# Patient Record
Sex: Female | Born: 1975 | Race: White | Hispanic: No | Marital: Single | Smoking: Never smoker
Health system: Southern US, Community
[De-identification: ages and names within clinical notes are randomized; demographics above are authoritative.]

## PROBLEM LIST (undated history)

## (undated) DIAGNOSIS — F32A Depression, unspecified: Secondary | ICD-10-CM

## (undated) DIAGNOSIS — E785 Hyperlipidemia, unspecified: Secondary | ICD-10-CM

## (undated) DIAGNOSIS — I10 Essential (primary) hypertension: Secondary | ICD-10-CM

## (undated) DIAGNOSIS — F329 Major depressive disorder, single episode, unspecified: Secondary | ICD-10-CM

## (undated) DIAGNOSIS — E119 Type 2 diabetes mellitus without complications: Secondary | ICD-10-CM

## (undated) HISTORY — PX: CHOLECYSTECTOMY: SHX55

## (undated) HISTORY — PX: ABDOMINAL HYSTERECTOMY: SHX81

---

## 2002-07-23 ENCOUNTER — Inpatient Hospital Stay (HOSPITAL_COMMUNITY): Admission: EM | Admit: 2002-07-23 | Discharge: 2002-07-24 | Payer: Self-pay | Admitting: Psychiatry

## 2011-09-17 DIAGNOSIS — F419 Anxiety disorder, unspecified: Secondary | ICD-10-CM | POA: Insufficient documentation

## 2011-09-17 DIAGNOSIS — K469 Unspecified abdominal hernia without obstruction or gangrene: Secondary | ICD-10-CM | POA: Insufficient documentation

## 2011-09-17 DIAGNOSIS — R5383 Other fatigue: Secondary | ICD-10-CM | POA: Insufficient documentation

## 2011-09-17 DIAGNOSIS — R1013 Epigastric pain: Secondary | ICD-10-CM | POA: Insufficient documentation

## 2011-11-17 DIAGNOSIS — R079 Chest pain, unspecified: Secondary | ICD-10-CM | POA: Insufficient documentation

## 2012-05-21 DIAGNOSIS — B009 Herpesviral infection, unspecified: Secondary | ICD-10-CM | POA: Insufficient documentation

## 2013-01-21 DIAGNOSIS — E785 Hyperlipidemia, unspecified: Secondary | ICD-10-CM | POA: Insufficient documentation

## 2013-01-21 DIAGNOSIS — E1169 Type 2 diabetes mellitus with other specified complication: Secondary | ICD-10-CM | POA: Insufficient documentation

## 2013-01-21 DIAGNOSIS — E66813 Obesity, class 3: Secondary | ICD-10-CM | POA: Insufficient documentation

## 2013-03-11 DIAGNOSIS — C541 Malignant neoplasm of endometrium: Secondary | ICD-10-CM | POA: Insufficient documentation

## 2013-05-16 DIAGNOSIS — Z598 Other problems related to housing and economic circumstances: Secondary | ICD-10-CM | POA: Insufficient documentation

## 2013-05-16 DIAGNOSIS — Z599 Problem related to housing and economic circumstances, unspecified: Secondary | ICD-10-CM | POA: Insufficient documentation

## 2014-11-04 DIAGNOSIS — F429 Obsessive-compulsive disorder, unspecified: Secondary | ICD-10-CM | POA: Insufficient documentation

## 2014-11-04 DIAGNOSIS — F322 Major depressive disorder, single episode, severe without psychotic features: Secondary | ICD-10-CM | POA: Insufficient documentation

## 2015-03-12 DIAGNOSIS — R809 Proteinuria, unspecified: Secondary | ICD-10-CM | POA: Insufficient documentation

## 2016-02-28 DIAGNOSIS — R0683 Snoring: Secondary | ICD-10-CM | POA: Insufficient documentation

## 2016-02-28 DIAGNOSIS — R29818 Other symptoms and signs involving the nervous system: Secondary | ICD-10-CM | POA: Insufficient documentation

## 2016-03-24 DIAGNOSIS — M25561 Pain in right knee: Secondary | ICD-10-CM | POA: Insufficient documentation

## 2017-02-06 DIAGNOSIS — S83206A Unspecified tear of unspecified meniscus, current injury, right knee, initial encounter: Secondary | ICD-10-CM | POA: Insufficient documentation

## 2017-03-14 ENCOUNTER — Encounter: Payer: Self-pay | Admitting: *Deleted

## 2017-03-14 ENCOUNTER — Emergency Department (INDEPENDENT_AMBULATORY_CARE_PROVIDER_SITE_OTHER)
Admission: EM | Admit: 2017-03-14 | Discharge: 2017-03-14 | Disposition: A | Discharge status: Home / Self Care | Payer: Medicaid Other | Source: Home / Self Care | Attending: Family Medicine | Admitting: Family Medicine

## 2017-03-14 DIAGNOSIS — R300 Dysuria: Secondary | ICD-10-CM | POA: Diagnosis not present

## 2017-03-14 DIAGNOSIS — R110 Nausea: Secondary | ICD-10-CM

## 2017-03-14 LAB — POCT URINALYSIS DIP (MANUAL ENTRY)
BILIRUBIN UA: NEGATIVE
BILIRUBIN UA: NEGATIVE mg/dL
GLUCOSE UA: NEGATIVE mg/dL
Leukocytes, UA: NEGATIVE
Nitrite, UA: NEGATIVE
Protein Ur, POC: 100 mg/dL — AB
UROBILINOGEN UA: 1 U/dL
pH, UA: 6 (ref 5.0–8.0)

## 2017-03-14 MED ORDER — ONDANSETRON 4 MG PO TBDP: ORAL_TABLET | ORAL | 0 refills | Status: DC

## 2017-03-14 MED ORDER — NITROFURANTOIN MONOHYD MACRO 100 MG PO CAPS: 100.0000 mg | ORAL_CAPSULE | Freq: Two times a day (BID) | ORAL | 0 refills | Status: DC

## 2017-03-14 NOTE — ED Provider Notes (Signed)
[name]    CSN: [id number] Arrival date & time: 03/14/17  1946     History   Chief Complaint Chief Complaint  Patient presents with  . Dysuria    HPI Tiffany Landry is a 41 y.o. female.   Patient awoke today with urinary frequency, nausea, fatigue, urgency, and sweats.  No abdominal or pelvic pain   The history is provided by the patient.  Dysuria  Pain quality:  Burning Pain severity:  Mild Onset quality:  Sudden Duration:  12 hours Timing:  Constant Progression:  Unchanged Chronicity:  New Recent urinary tract infections: no   Relieved by:  None tried Worsened by:  Nothing Ineffective treatments:  None tried Urinary symptoms: frequent urination and hesitancy   Urinary symptoms: no discolored urine, no foul-smelling urine, no hematuria and no bladder incontinence   Associated symptoms: nausea   Associated symptoms: no abdominal pain, no fever, no flank pain, no genital lesions, no vaginal discharge and no vomiting     History reviewed. No pertinent past medical history.  There are no active problems to display for this patient.   Past Surgical History:  Procedure Laterality Date  . ABDOMINAL HYSTERECTOMY    . CHOLECYSTECTOMY      OB History    No data available       Home Medications    Prior to Admission medications   Medication Sig Start Date End Date Taking? Authorizing Provider  lisinopril (PRINIVIL,ZESTRIL) 10 MG tablet Take 10 mg by mouth daily.   Yes [provider]  metFORMIN (GLUCOPHAGE) 1000 MG tablet Take 1,000 mg by mouth 2 (two) times daily with a meal.   Yes [provider]  nitrofurantoin, macrocrystal-monohydrate, (MACROBID) 100 MG capsule Take 1 capsule (100 mg total) by mouth 2 (two) times daily. Take with food. 03/14/17   Kandra Nicolas, MD  ondansetron (ZOFRAN ODT) 4 MG disintegrating tablet Take one tab by mouth Q6hr prn nausea.  Dissolve under tongue. 03/14/17   Kandra Nicolas, MD    Family  History Family History  Problem Relation Age of Onset  . Heart disease Father     Social History Social History  Substance Use Topics  . Smoking status: Never Smoker  . Smokeless tobacco: Never Used  . Alcohol use No     Allergies   Aspirin   Review of Systems Review of Systems  Constitutional: Negative for fever.  Gastrointestinal: Positive for nausea. Negative for abdominal pain and vomiting.  Genitourinary: Positive for dysuria. Negative for flank pain and vaginal discharge.  All other systems reviewed and are negative.    Physical Exam Triage Vital Signs ED Triage Vitals  Enc Vitals Group     BP 03/14/17 2005 (!) 150/85     Pulse Rate 03/14/17 2005 83     Resp 03/14/17 2005 16     Temp 03/14/17 2005 97.7 F (36.5 C)     Temp Source 03/14/17 2005 Oral     SpO2 03/14/17 2005 97 %     Weight 03/14/17 2006 (!) 304 lb (137.9 kg)     Height 03/14/17 2006 5\' 7"  (1.702 m)     Head Circumference --      Peak Flow --      Pain Score 03/14/17 2006 0     Pain Loc --      Pain Edu? --      Excl. in [place]? --    No data found.   Updated Vital Signs BP Marland Kitchen)  150/85 (BP Location: Left Arm)   Pulse 83   Temp 97.7 F (36.5 C) (Oral)   Resp 16   Ht 5\' 7"  (1.702 m)   Wt (!) 304 lb (137.9 kg)   SpO2 97%   BMI 47.61 kg/m   Visual Acuity Right Eye Distance:   Left Eye Distance:   Bilateral Distance:    Right Eye Near:   Left Eye Near:    Bilateral Near:     Physical Exam Nursing notes and Vital Signs reviewed. Appearance:  Patient appears stated age, and in no acute distress.    Eyes:  Pupils are equal, round, and reactive to light and accomodation.  Extraocular movement is intact.  Conjunctivae are not inflamed   Pharynx:  Normal; moist mucous membranes  Neck:  Supple.  No adenopathy Lungs:  Clear to auscultation.  Breath sounds are equal.  Moving air well. Heart:  Regular rate and rhythm without murmurs, rubs, or gallops.  Abdomen:  Nontender without masses  or hepatosplenomegaly.  Bowel sounds are present.  No CVA or flank tenderness.  Extremities:  No edema.  Skin:  No rash present.     UC Treatments / Results  Labs (all labs ordered are listed, but only abnormal results are displayed) Labs Reviewed  POCT URINALYSIS DIP (MANUAL ENTRY) - Abnormal; Notable for the following:       Result Value   Spec Grav, UA >=1.030 (*)    Blood, UA trace-intact (*)    Protein Ur, POC =100 (*)    All other components within normal limits  URINE CULTURE    EKG  EKG Interpretation None       Radiology No results found.  Procedures Procedures (including critical care time)  Medications Ordered in UC Medications - No data to display   Initial Impression / Assessment and Plan / UC Course  I have reviewed the triage vital signs and the nursing notes.  Pertinent labs & imaging results that were available during my care of the patient were reviewed by me and considered in my medical decision making (see chart for details).    Urine culture pending. Rx for Zofran. Begin Macrobid 100mg  BID for one week. Increase fluid intake. May use non-prescription AZO for about two days, if desired, to decrease urinary discomfort.  If symptoms become significantly worse during the night or over the weekend, proceed to the local emergency room.  Followup with Family Doctor if not improved in one week.    Final Clinical Impressions(s) / UC Diagnoses   Final diagnoses:  Dysuria  Nausea without vomiting    New Prescriptions New Prescriptions   NITROFURANTOIN, MACROCRYSTAL-MONOHYDRATE, (MACROBID) 100 MG CAPSULE    Take 1 capsule (100 mg total) by mouth 2 (two) times daily. Take with food.   ONDANSETRON (ZOFRAN ODT) 4 MG DISINTEGRATING TABLET    Take one tab by mouth Q6hr prn nausea.  Dissolve under tongue.     Kandra Nicolas, MD 03/16/17 [phone]

## 2017-03-14 NOTE — Discharge Instructions (Signed)
Increase fluid intake. May use non-prescription AZO for about two days, if desired, to decrease urinary discomfort.  If symptoms become significantly worse during the night or over the weekend, proceed to the local emergency room.  

## 2017-03-14 NOTE — ED Triage Notes (Signed)
Pt c/o urinary urgency, nausea and dizziness x today. She reports blood sugar at home at 1900 was 168.

## 2017-03-15 LAB — URINE CULTURE: Organism ID, Bacteria: NO GROWTH

## 2017-03-16 ENCOUNTER — Telehealth: Payer: Self-pay

## 2017-03-16 NOTE — Telephone Encounter (Signed)
Left message on VM with UCX results and to call the office if any questions or concerns.

## 2017-05-04 ENCOUNTER — Encounter: Payer: Self-pay | Admitting: Cardiology

## 2017-05-04 ENCOUNTER — Ambulatory Visit (INDEPENDENT_AMBULATORY_CARE_PROVIDER_SITE_OTHER): Payer: Medicaid Other | Admitting: Cardiology

## 2017-05-04 VITALS — BP 124/72 | HR 80 | Ht 67.0 in | Wt 308.0 lb

## 2017-05-04 DIAGNOSIS — E1169 Type 2 diabetes mellitus with other specified complication: Secondary | ICD-10-CM | POA: Diagnosis not present

## 2017-05-04 DIAGNOSIS — E785 Hyperlipidemia, unspecified: Secondary | ICD-10-CM | POA: Diagnosis not present

## 2017-05-04 DIAGNOSIS — R079 Chest pain, unspecified: Secondary | ICD-10-CM | POA: Diagnosis not present

## 2017-05-04 DIAGNOSIS — I10 Essential (primary) hypertension: Secondary | ICD-10-CM | POA: Diagnosis not present

## 2017-05-04 DIAGNOSIS — R002 Palpitations: Secondary | ICD-10-CM | POA: Diagnosis not present

## 2017-05-04 NOTE — Patient Instructions (Signed)
Medication Instructions:   Your physician recommends that you continue on your current medications as directed. Please refer to the Current Medication list given to you today.  Nitroglycerin  has been sent to your pharmacy. This is only to be taken as needed for chest pain only.   Labwork:   NONE  Testing/Procedures:  Your physician has requested that you have a lexiscan myoview. For further information please visit [email or web address]. Please follow instruction sheet, as given.  Your physician has recommended that you wear an event monitor. Event monitors are medical devices that record the heart's electrical activity. Doctors most often [place] these monitors to diagnose arrhythmias. Arrhythmias are problems with the speed or rhythm of the heartbeat. The monitor is a small, portable device. You can wear one while you do your normal daily activities. This is usually used to diagnose what is causing palpitations/syncope (passing out).  These test will be done at our [facility] location.     [address], [place], [place] [address]  Follow-Up:  Your physician recommends that you schedule a follow-up appointment in: 6 months with Dr. Geraldo Pitter for routine check up.    Any Other Special Instructions Will Be Listed Below (If Applicable).     If you need a refill on your cardiac medications before your next appointment, please call your pharmacy.

## 2017-05-04 NOTE — Progress Notes (Signed)
Cardiology Office Note:    Date:  05/04/2017   ID:  Tiffany Landry, DOB 26-Dec-1975, MRN [id number]  PCP:  System, Pcp Not In  Cardiologist:  Jenean Lindau, MD   Referring MD: Gildardo Pounds, NP    ASSESSMENT:    1. Palpitations   2. DM type 2 with diabetic dyslipidemia (HCC)   3. Chest pain, unspecified type   4. Essential hypertension    PLAN:    In order of problems listed above:  1. I discussed my findings with the patient at extensive length. Her palpitations are of concern and we will do a 1 week event monitoring. She is "have blood work at the primary care physician including TSH. 2. She has multiple risk factors for coronary artery disease and we'll do a Lexiscan sestamibi to make sure she has no objective evidence of coronary artery disease. 3. Her blood pressure stable 4. Lipids are followed by her primary care physician. Diet was discussed with dyslipidemia and obesity and she verbalized understanding and plans to be proactive and loose awake. She'll be seen in follow-up appointment in 2 months or earlier if she has any concerns. She knows to go to the nearest emergency room for any significant concerns. Sublingual nitroglycerin prescription was sent. His protocol 911 protocol explained.   Medication Adjustments/Labs and Tests Ordered: Current medicines are reviewed at length with the patient today.  Concerns regarding medicines are outlined above.  Orders Placed This Encounter  Procedures  . Cardiac event monitor  . Myocardial Perfusion Imaging   No orders of the defined types were placed in this encounter.    History of Present Illness:    Tiffany Landry is a 41 y.o. female who is being seen today for the evaluation of Palpitations and chest pain at the request of Gildardo Pounds, NP. Patient is a pleasant 41 year old female. She has past medical history of essential hypertension, dyslipidemia and diabetes mellitus. She is morbidly obese and leads a sedentary  lifestyle. She tells me that she was evaluated for palpitations and did a Holter monitor for 2 days and was not happy with that. She was told her that her results were fine. She also complains of chest tightness at times this is not related to exertion. Again she leads a sedentary lifestyle. At the time of my evaluation she is alert awake oriented and in no distress. No radiation of chest symptoms to the neck or to the arms.  History reviewed. No pertinent past medical history.  Past Surgical History:  Procedure Laterality Date  . ABDOMINAL HYSTERECTOMY    . CHOLECYSTECTOMY      Current Medications: Current Meds  Medication Sig  . CINNAMON PO Take 500 mg by mouth 4 (four) times daily.  . fluvoxaMINE (LUVOX) 25 MG tablet Take 25 mg by mouth 2 (two) times daily.  Marland Kitchen lisinopril (PRINIVIL,ZESTRIL) 5 MG tablet Take 5 mg by mouth daily.  . metFORMIN (GLUCOPHAGE) 1000 MG tablet Take 1,000 mg by mouth 2 (two) times daily with a meal.  . OMEGA-3 FATTY ACIDS PO Take 1,200 mg by mouth 2 (two) times daily.  Marland Kitchen VITAMIN E PO Take 180 mg by mouth daily.  . [DISCONTINUED] lisinopril (PRINIVIL,ZESTRIL) 10 MG tablet Take 10 mg by mouth daily.     Allergies:   Aspirin   Social History   Social History  . Marital status: Single    Spouse name: N/A  . Number of children: N/A  . Years of education: N/A  Social History Main Topics  . Smoking status: Never Smoker  . Smokeless tobacco: Never Used  . Alcohol use No  . Drug use: No  . Sexual activity: Not Asked   Other Topics Concern  . None   Social History Narrative  . None     Family History: The patient's family history includes Arrhythmia in her father; Heart disease in her father and paternal grandfather.  ROS:   Please see the history of present illness.    All other systems reviewed and are negative.  EKGs/Labs/Other Studies Reviewed:    The following studies were reviewed today: I reviewed records including Holter monitor report  from the other cardiologist office and it was unremarkable. EKG done today reveals sinus rhythm with nonspecific ST-T changes.   Recent Labs: No results found for requested labs within last 8760 hours.  Recent Lipid Panel No results found for: CHOL, TRIG, HDL, CHOLHDL, VLDL, LDLCALC, LDLDIRECT  Physical Exam:    VS:  BP 124/72   Pulse 80   Ht 5\' 7"  (1.702 m)   Wt (!) 308 lb 0.6 oz (139.7 kg)   SpO2 97%   BMI 48.25 kg/m     Wt Readings from Last 3 Encounters:  05/04/17 (!) 308 lb 0.6 oz (139.7 kg)  03/14/17 (!) 304 lb (137.9 kg)     GEN: Patient is in no acute distress HEENT: Normal NECK: No JVD; No carotid bruits LYMPHATICS: No lymphadenopathy CARDIAC: S1 S2 regular, 2/6 systolic murmur at the apex. RESPIRATORY:  Clear to auscultation without rales, wheezing or rhonchi  ABDOMEN: Soft, non-tender, non-distended MUSCULOSKELETAL:  No edema; No deformity  SKIN: Warm and dry NEUROLOGIC:  Alert and oriented x 3 PSYCHIATRIC:  Normal affect    Signed, Jenean Lindau, MD  05/04/2017 3:51 PM    [facility]

## 2017-05-08 ENCOUNTER — Other Ambulatory Visit (INDEPENDENT_AMBULATORY_CARE_PROVIDER_SITE_OTHER): Payer: Medicaid Other

## 2017-05-08 DIAGNOSIS — R079 Chest pain, unspecified: Secondary | ICD-10-CM | POA: Diagnosis not present

## 2017-05-21 ENCOUNTER — Ambulatory Visit (HOSPITAL_COMMUNITY): Payer: Self-pay

## 2017-05-22 ENCOUNTER — Encounter: Payer: Self-pay | Admitting: *Deleted

## 2017-05-22 ENCOUNTER — Ambulatory Visit (HOSPITAL_COMMUNITY): Payer: Medicaid Other

## 2017-05-22 NOTE — Progress Notes (Signed)
Patient ID: Tiffany Landry, female   DOB: 30-Oct-1975, 41 y.o.   MRN: [id number] Patient did not show up for 05/22/17, 2:00 PM, appointment to have a cardiac event monitor applied.

## 2017-05-28 ENCOUNTER — Telehealth (HOSPITAL_COMMUNITY): Payer: Self-pay | Admitting: *Deleted

## 2017-05-28 NOTE — Telephone Encounter (Signed)
Left message on voicemail in reference to upcoming appointment scheduled for 05/29/17. Phone number given for a call back so details instructions can be given. Lilyona Richner, Ranae Palms

## 2017-05-29 ENCOUNTER — Ambulatory Visit (HOSPITAL_COMMUNITY): Payer: Medicaid Other | Attending: Cardiology

## 2017-05-29 DIAGNOSIS — R079 Chest pain, unspecified: Secondary | ICD-10-CM | POA: Diagnosis not present

## 2017-05-29 MED ORDER — TECHNETIUM TC 99M TETROFOSMIN IV KIT
32.4000 | PACK | Freq: Once | INTRAVENOUS | Status: AC | PRN
  Administered 2017-05-29: 32.4 via INTRAVENOUS
  Filled 2017-05-29: qty 33

## 2017-05-31 ENCOUNTER — Ambulatory Visit (INDEPENDENT_AMBULATORY_CARE_PROVIDER_SITE_OTHER): Payer: Medicaid Other

## 2017-05-31 ENCOUNTER — Ambulatory Visit (HOSPITAL_COMMUNITY): Payer: Medicaid Other | Attending: Interventional Cardiology

## 2017-05-31 DIAGNOSIS — E1169 Type 2 diabetes mellitus with other specified complication: Secondary | ICD-10-CM | POA: Diagnosis not present

## 2017-05-31 DIAGNOSIS — E785 Hyperlipidemia, unspecified: Secondary | ICD-10-CM | POA: Diagnosis not present

## 2017-05-31 DIAGNOSIS — I10 Essential (primary) hypertension: Secondary | ICD-10-CM | POA: Diagnosis not present

## 2017-05-31 DIAGNOSIS — R079 Chest pain, unspecified: Secondary | ICD-10-CM

## 2017-05-31 DIAGNOSIS — R002 Palpitations: Secondary | ICD-10-CM | POA: Diagnosis not present

## 2017-05-31 LAB — MYOCARDIAL PERFUSION IMAGING
CHL CUP NUCLEAR SSS: 4
LV sys vol: 62 mL
LVDIAVOL: 131 mL (ref 46–106)
Peak HR: 109 {beats}/min
RATE: 0.29
Rest HR: 77 {beats}/min
SDS: 3
SRS: 1
TID: 0.92

## 2017-05-31 MED ORDER — REGADENOSON 0.4 MG/5ML IV SOLN
0.4000 mg | Freq: Once | INTRAVENOUS | Status: AC
  Administered 2017-05-31: 0.4 mg via INTRAVENOUS

## 2017-05-31 MED ORDER — TECHNETIUM TC 99M TETROFOSMIN IV KIT
32.6000 | PACK | Freq: Once | INTRAVENOUS | Status: AC | PRN
  Administered 2017-05-31: 32.6 via INTRAVENOUS
  Filled 2017-05-31: qty 33

## 2017-06-04 ENCOUNTER — Telehealth: Payer: Self-pay

## 2017-06-04 NOTE — Telephone Encounter (Signed)
Patient requesting cardiac clearance for tooth needing to be pulled. Per Dr. Geraldo Pitter this is okay. Faxed documents needed to [place] with Dr. Valentino Nose office. Mattie Marlin

## 2017-06-19 ENCOUNTER — Ambulatory Visit: Payer: Medicaid Other | Admitting: Cardiology

## 2017-06-19 ENCOUNTER — Telehealth: Payer: Self-pay | Admitting: Cardiology

## 2017-06-19 ENCOUNTER — Other Ambulatory Visit: Payer: Self-pay

## 2017-06-19 DIAGNOSIS — R002 Palpitations: Secondary | ICD-10-CM

## 2017-06-19 NOTE — Telephone Encounter (Signed)
Per Dr. Geraldo Pitter an echo will be ordered. Patient is scheduled for 11/16 at 09:30. She is agreeable with this plan.

## 2017-06-19 NOTE — Telephone Encounter (Signed)
New message     Patient is requesting labs to be ordered. States she feels something more can be done to figure out why she is having palpitations. Please call Patient cancelled appointment for today, did not wish to reschedule at this time.  Patient c/o Palpitations:  High priority if patient c/o lightheadedness, shortness of breath, or chest pain  1) How long have you had palpitations/irregular HR/ Afib? Are you having the symptoms now? Palpitations everyday  2) Are you currently experiencing lightheadedness, SOB or CP? NO  3) Do you have a history of afib (atrial fibrillation) or irregular heart rhythm? NO  4) Have you checked your BP or HR? (document readings if available): no  5) Are you experiencing any other symptoms? No

## 2017-07-18 ENCOUNTER — Telehealth: Payer: Self-pay | Admitting: Cardiology

## 2017-07-18 NOTE — Telephone Encounter (Signed)
Will call as soon as I have results.

## 2017-07-18 NOTE — Telephone Encounter (Signed)
Wants monitor results °

## 2017-07-19 ENCOUNTER — Telehealth: Payer: Self-pay

## 2017-07-19 ENCOUNTER — Other Ambulatory Visit: Payer: Self-pay

## 2017-07-19 MED ORDER — METOPROLOL TARTRATE 25 MG PO TABS: 25.0000 mg | ORAL_TABLET | Freq: Two times a day (BID) | ORAL | 0 refills | Status: DC

## 2017-07-19 NOTE — Telephone Encounter (Signed)
Left voicemail for the patient to call the office. 

## 2017-07-19 NOTE — Telephone Encounter (Signed)
Informed patient of her monitor results and explained the need for the beta blocker. The patient was agreeable.

## 2017-07-20 ENCOUNTER — Ambulatory Visit (HOSPITAL_BASED_OUTPATIENT_CLINIC_OR_DEPARTMENT_OTHER)
Admission: RE | Admit: 2017-07-20 | Discharge: 2017-07-20 | Disposition: A | Discharge status: Home / Self Care | Payer: Medicaid Other | Source: Ambulatory Visit | Attending: Cardiology | Admitting: Cardiology

## 2017-07-20 ENCOUNTER — Other Ambulatory Visit (HOSPITAL_BASED_OUTPATIENT_CLINIC_OR_DEPARTMENT_OTHER): Payer: Medicaid Other

## 2017-07-20 DIAGNOSIS — I10 Essential (primary) hypertension: Secondary | ICD-10-CM | POA: Diagnosis not present

## 2017-07-20 DIAGNOSIS — R002 Palpitations: Secondary | ICD-10-CM

## 2017-07-20 DIAGNOSIS — E119 Type 2 diabetes mellitus without complications: Secondary | ICD-10-CM | POA: Insufficient documentation

## 2017-07-20 DIAGNOSIS — E785 Hyperlipidemia, unspecified: Secondary | ICD-10-CM | POA: Diagnosis not present

## 2017-07-20 NOTE — Progress Notes (Signed)
  Echocardiogram 2D Echocardiogram has been performed.  Gloria Lambertson T Rigo Letts 07/20/2017, 3:26 PM

## 2017-07-24 ENCOUNTER — Telehealth: Payer: Self-pay | Admitting: Cardiology

## 2017-07-24 NOTE — Telephone Encounter (Signed)
Spoke with patient to calm her nerves regarding her echo and monitor report.

## 2017-07-24 NOTE — Telephone Encounter (Signed)
Please call patient regarding her concerns over her BP medicine.Marland Kitchen

## 2017-08-09 ENCOUNTER — Ambulatory Visit: Payer: Medicaid Other | Admitting: Cardiology

## 2017-08-09 ENCOUNTER — Encounter: Payer: Self-pay | Admitting: Cardiology

## 2017-08-09 VITALS — BP 132/78 | HR 78 | Ht 67.0 in | Wt 308.1 lb

## 2017-08-09 DIAGNOSIS — E1169 Type 2 diabetes mellitus with other specified complication: Secondary | ICD-10-CM | POA: Diagnosis not present

## 2017-08-09 DIAGNOSIS — I10 Essential (primary) hypertension: Secondary | ICD-10-CM | POA: Diagnosis not present

## 2017-08-09 DIAGNOSIS — E785 Hyperlipidemia, unspecified: Secondary | ICD-10-CM

## 2017-08-09 NOTE — Patient Instructions (Signed)
Medication Instructions:  Your physician recommends that you continue on your current medications as directed. Please refer to the Current Medication list given to you today.  Labwork: None  Testing/Procedures: None  Follow-Up: Your physician recommends that you schedule a follow-up appointment in: 6 months  Any Other Special Instructions Will Be Listed Below (If Applicable).     If you need a refill on your cardiac medications before your next appointment, please call your pharmacy.   [facility]  Elijahjames Fuelling A, RN, BSN  

## 2017-08-09 NOTE — Progress Notes (Signed)
Cardiology Office Note:    Date:  08/09/2017   ID:  Tiffany Landry, DOB 09/29/1975, MRN [id number]  PCP:  Daleen Snook, NP  Cardiologist:  Jenean Lindau, MD   Referring MD: Daleen Snook, NP    ASSESSMENT:    1. Essential hypertension   2. DM type 2 with diabetic dyslipidemia (HCC)   3. Obesity, Class III, BMI 40-49.9 (morbid obesity) ([place])    PLAN:    In order of problems listed above:  1. Primary prevention stressed to the patient.  Importance of compliance with diet and medications stressed and she vocalized understanding.  Diet was discussed for obesity and this were explained and she plans to diet and eat appropriately and lose weight.  Importance of regular exercise stressed and she plans to start a regular exercise program.  She is asymptomatic from this standpoint. 2. Her blood pressure is stable 3. I have mentioned to her that she needs to continue aspirin in view of the fact that she is diabetic.  I also discussed with her statin therapy is essential antibiotics and guidelines were discussed and she will discuss this with her primary care physician who is managing this aspect of her care. 4. Patient will be seen in follow-up appointment in 6 months or earlier if the patient has any concerns    Medication Adjustments/Labs and Tests Ordered: Current medicines are reviewed at length with the patient today.  Concerns regarding medicines are outlined above.  No orders of the defined types were placed in this encounter.  No orders of the defined types were placed in this encounter.    Chief Complaint  Patient presents with  . Follow-up  . Palpitations    has been taking the metoprolol     History of Present Illness:    Tiffany Landry is a 41 y.o. female.  She has past medical history of essential hypertension dyslipidemia, diabetes mellitus and morbid obesity.  She leads a very sedentary lifestyle.  She was evaluated for symptoms of palpitations.  Holter  monitoring and event monitoring has been unremarkable with a 5 beat nonsustained ventricular tachycardia.  Ejection fraction is preserved and there was no evidence of ischemia on the stress test.  I asked the patient repeatedly if she had any chest pain and she denies this.  She tells me that she is active.  She walks the dog and she can walk for about half an hour on a regular basis but she does not exercise and leads a sedentary lifestyle.  When she walks the dog's she has no chest pain or any such symptoms.  She tells me that when she is sleeping by herself in the night she can feel her heart skipped beats.  No palpitations just a feeling of skipped beats which worries her.  She is taking metoprolol regularly.  She stopped taking lipid medications given by her primary care physician.  She courts no particular reason for quitting it.  History reviewed. No pertinent past medical history.  Past Surgical History:  Procedure Laterality Date  . ABDOMINAL HYSTERECTOMY    . CHOLECYSTECTOMY      Current Medications: Current Meds  Medication Sig  . aspirin 81 MG tablet Take by mouth.  Marland Kitchen buPROPion (WELLBUTRIN XL) 150 MG 24 hr tablet Take by mouth.  Marland Kitchen glucose blood (ONE TOUCH ULTRA TEST) test strip 1 Container by Misc.(Non-Drug; Combo Route) route daily.  Marland Kitchen lisinopril (PRINIVIL,ZESTRIL) 5 MG tablet Take 10 mg by mouth daily.   . metFORMIN (  GLUCOPHAGE) 1000 MG tablet Take 1,000 mg by mouth 2 (two) times daily with a meal.  . metoprolol tartrate (LOPRESSOR) 25 MG tablet Take 1 tablet (25 mg total) 2 (two) times daily by mouth.  . OMEGA-3 FATTY ACIDS PO Take 1,200 mg by mouth 2 (two) times daily.  Marland Kitchen omeprazole (PRILOSEC) 20 MG capsule Take by mouth.  . Red Yeast Rice 600 MG CAPS Take 1,200 mg by mouth daily.  Marland Kitchen VITAMIN E PO Take 180 mg by mouth daily.     Allergies:   Patient has no known allergies.   Social History   Socioeconomic History  . Marital status: Single    Spouse name: None  . Number  of children: None  . Years of education: None  . Highest education level: None  Social Needs  . Financial resource strain: None  . Food insecurity - worry: None  . Food insecurity - inability: None  . Transportation needs - medical: None  . Transportation needs - non-medical: None  Occupational History  . None  Tobacco Use  . Smoking status: Never Smoker  . Smokeless tobacco: Never Used  Substance and Sexual Activity  . Alcohol use: No  . Drug use: No  . Sexual activity: None  Other Topics Concern  . None  Social History Narrative  . None     Family History: The patient's family history includes Arrhythmia in her father; Heart disease in her father and paternal grandfather.  ROS:   Please see the history of present illness.    All other systems reviewed and are negative.  EKGs/Labs/Other Studies Reviewed:    The following studies were reviewed today: I discussed findings of the echocardiogram and stress test with the patient at extensive length.  Holter monitoring report was also discussed at length and questions were answered to her satisfaction.   Recent Labs: No results found for requested labs within last 8760 hours.  Recent Lipid Panel No results found for: CHOL, TRIG, HDL, CHOLHDL, VLDL, LDLCALC, LDLDIRECT  Physical Exam:    VS:  BP 132/78 (BP Location: Left Arm, Patient Position: Sitting)   Pulse 78   Ht 5\' 7"  (1.702 m)   Wt (!) 308 lb 1.3 oz (139.7 kg)   SpO2 98%   BMI 48.25 kg/m     Wt Readings from Last 3 Encounters:  08/09/17 (!) 308 lb 1.3 oz (139.7 kg)  05/29/17 (!) 308 lb (139.7 kg)  05/04/17 (!) 308 lb 0.6 oz (139.7 kg)     GEN: Patient is in no acute distress HEENT: Normal NECK: No JVD; No carotid bruits LYMPHATICS: No lymphadenopathy CARDIAC: Hear sounds regular, 2/6 systolic murmur at the apex. RESPIRATORY:  Clear to auscultation without rales, wheezing or rhonchi  ABDOMEN: Soft, non-tender, non-distended MUSCULOSKELETAL:  No edema;  No deformity  SKIN: Warm and dry NEUROLOGIC:  Alert and oriented x 3 PSYCHIATRIC:  Normal affect   Signed, Jenean Lindau, MD  08/09/2017 11:14 AM    [place]

## 2017-10-01 ENCOUNTER — Other Ambulatory Visit: Payer: Self-pay

## 2017-10-01 ENCOUNTER — Encounter: Payer: Self-pay | Admitting: *Deleted

## 2017-10-01 ENCOUNTER — Emergency Department
Admission: EM | Admit: 2017-10-01 | Discharge: 2017-10-01 | Disposition: A | Discharge status: Home / Self Care | Payer: Medicaid Other | Source: Home / Self Care | Attending: Family Medicine | Admitting: Family Medicine

## 2017-10-01 DIAGNOSIS — R110 Nausea: Secondary | ICD-10-CM

## 2017-10-01 DIAGNOSIS — R197 Diarrhea, unspecified: Secondary | ICD-10-CM

## 2017-10-01 HISTORY — DX: Type 2 diabetes mellitus without complications: E11.9

## 2017-10-01 HISTORY — DX: Essential (primary) hypertension: I10

## 2017-10-01 HISTORY — DX: Hyperlipidemia, unspecified: E78.5

## 2017-10-01 LAB — POCT CBC W AUTO DIFF (K'VILLE URGENT CARE)

## 2017-10-01 MED ORDER — CIPROFLOXACIN HCL 500 MG PO TABS: 500.0000 mg | ORAL_TABLET | Freq: Two times a day (BID) | ORAL | 0 refills | Status: DC

## 2017-10-01 MED ORDER — ONDANSETRON HCL 4 MG PO TABS
4.0000 mg | ORAL_TABLET | Freq: Four times a day (QID) | ORAL | 0 refills | Status: DC
Start: 2017-10-01 — End: 2019-08-14

## 2017-10-01 MED ORDER — METRONIDAZOLE 500 MG PO TABS: 500.0000 mg | ORAL_TABLET | Freq: Three times a day (TID) | ORAL | 0 refills | Status: DC

## 2017-10-01 NOTE — ED Triage Notes (Signed)
Pt c/o diarrhea, upper abd pain and some nausea x 2 wks. Denies vomiting or blood in stool.

## 2017-10-01 NOTE — ED Provider Notes (Signed)
[facility]    CSN: [id number] Arrival date & time: 10/01/17  1921     History   Chief Complaint Chief Complaint  Patient presents with  . Diarrhea    HPI Tiffany Landry is a 42 y.o. female.   HPI Tiffany Landry is a 42 y.o. female presenting to UC with c/o 2 weeks of intermittent watery diarrhea that worsened over the last 2-3 days.  Today she had 8-10 episodes of loose to watery stool in last 24 hours.  She reports scant red blood in stool but believes that is due to hemorrhoids "I am so raw down there."  She has not tried any OTC medications for her symptoms. Mild diffuse abdominal cramping. Mild nausea but no vomiting, fever, or chills.  She had her gallbladder removed several years ago. She notes if she eats fatty foods, it causes loose stools and stomach upset but she has not had any of those foods recently. Denies sick contacts or recent travel.    Past Medical History:  Diagnosis Date  . Diabetes mellitus without complication ([place])   . Hyperlipidemia   . Hypertension     Patient Active Problem List   Diagnosis Date Noted  . Palpitations 05/04/2017  . Essential hypertension 05/04/2017  . Acute meniscal tear of right knee 02/06/2017  . Right knee pain 03/24/2016  . Snoring 02/28/2016  . Suspected sleep apnea 02/28/2016  . Microalbuminuria 03/12/2015  . Major depressive disorder, single episode, severe without psychotic features ([place]) 11/04/2014  . Obsessive compulsive disorder 11/04/2014  . Financial difficulty 05/16/2013  . Endometrial cancer ([place]) 03/11/2013  . 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 Obesity, Class III, BMI 40-49.9 (morbid obesity) ([place]) 01/21/2013  . HSV-1 (herpes simplex virus 1) infection 05/21/2012  . Chest pain 11/17/2011  . Abdominal hernia without obstruction or gangrene 09/17/2011  . Anxiety 09/17/2011  . Epigastric pain 09/17/2011  . Other fatigue 09/17/2011    Past Surgical History:  Procedure Laterality  Date  . ABDOMINAL HYSTERECTOMY    . CHOLECYSTECTOMY      OB History    No data available       Home Medications    Prior to Admission medications   Medication Sig Start Date End Date Taking? Authorizing Provider  buPROPion (WELLBUTRIN XL) 150 MG 24 hr tablet Take by mouth. 07/10/17 10/08/17 Yes [provider]  metFORMIN (GLUCOPHAGE) 1000 MG tablet Take 1,000 mg by mouth 2 (two) times daily with a meal.   Yes [provider]  metoprolol tartrate (LOPRESSOR) 25 MG tablet Take 1 tablet (25 mg total) 2 (two) times daily by mouth. 07/19/17 10/17/17 Yes Revankar, Reita Cliche, MD  aspirin 81 MG tablet Take by mouth.    [provider]  CINNAMON PO Take 500 mg by mouth 4 (four) times daily.    [provider]  ciprofloxacin (CIPRO) 500 MG tablet Take 1 tablet (500 mg total) by mouth 2 (two) times daily. One po bid x 7 days 10/01/17   Noe Gens, PA-C  fluvoxaMINE (LUVOX) 25 MG tablet Take 25 mg by mouth 2 (two) times daily. 04/17/17 05/17/17  [provider]  glucose blood (ONE TOUCH ULTRA TEST) test strip 1 Container by Misc.(Non-Drug; Combo Route) route daily. 11/26/12   [provider]  lisinopril (PRINIVIL,ZESTRIL) 5 MG tablet Take 10 mg by mouth daily.  11/27/16 11/27/17  [provider]  metroNIDAZOLE (FLAGYL) 500 MG tablet Take 1 tablet (500 mg total) by mouth  3 (three) times daily. 10/01/17   Noe Gens, PA-C  OMEGA-3 FATTY ACIDS PO Take 1,200 mg by mouth 2 (two) times daily.    [provider]  omeprazole (PRILOSEC) 20 MG capsule Take by mouth. 07/13/17   [provider]  ondansetron (ZOFRAN) 4 MG tablet Take 1 tablet (4 mg total) by mouth every 6 (six) hours. 10/01/17   Noe Gens, PA-C  Red Yeast Rice 600 MG CAPS Take 1,200 mg by mouth daily.    [provider]  VITAMIN E PO Take 180 mg by mouth daily.    [provider]    Family History Family History  Problem Relation Age of Onset    . Heart disease Father   . Arrhythmia Father   . Heart disease Paternal Grandfather     Social History Social History   Tobacco Use  . Smoking status: Never Smoker  . Smokeless tobacco: Never Used  Substance Use Topics  . Alcohol use: No  . Drug use: No     Allergies   Patient has no known allergies.   Review of Systems Review of Systems  Constitutional: Positive for unexpected weight change. Negative for chills, fatigue and fever.  Gastrointestinal: Positive for abdominal pain, blood in stool, diarrhea and nausea. Negative for constipation and vomiting.  Genitourinary: Negative for dysuria, frequency and hematuria.  Musculoskeletal: Negative for back pain and myalgias.  Neurological: Negative for dizziness, syncope, light-headedness and headaches.     Physical Exam Triage Vital Signs ED Triage Vitals [10/01/17 1942]  Enc Vitals Group     BP 124/85     Pulse Rate 91     Resp 16     Temp (!) 97.4 F (36.3 C)     Temp Source Oral     SpO2 96 %     Weight 300 lb (136.1 kg)     Height 5\' 7"  (1.702 m)     Head Circumference      Peak Flow      Pain Score 6     Pain Loc      Pain Edu?      Excl. in [place]?    No data found.  Updated Vital Signs BP 124/85 (BP Location: Right Arm)   Pulse 91   Temp (!) 97.4 F (36.3 C) (Oral)   Resp 16   Ht 5\' 7"  (1.702 m)   Wt 300 lb (136.1 kg)   SpO2 96%   BMI 46.99 kg/m   Visual Acuity Right Eye Distance:   Left Eye Distance:   Bilateral Distance:    Right Eye Near:   Left Eye Near:    Bilateral Near:     Physical Exam  Constitutional: She is oriented to person, place, and time. She appears well-developed and well-nourished. No distress.  HENT:  Head: Normocephalic and atraumatic.  Mouth/Throat: Oropharynx is clear and moist.  Eyes: EOM are normal.  Neck: Normal range of motion. Neck supple.  Cardiovascular: Normal rate and regular rhythm.  Pulmonary/Chest: Effort normal and breath sounds normal. No stridor.  No respiratory distress. She has no wheezes. She has no rales.  Abdominal: Soft. She exhibits no distension and no mass. There is tenderness ( mild, diffuse). There is no rebound and no guarding.  Obese abdomen, soft, diffuse tenderness.   Musculoskeletal: Normal range of motion.  Neurological: She is alert and oriented to person, place, and time.  Skin: Skin is warm and dry. She is not diaphoretic.  Psychiatric:  She has a normal mood and affect. Her behavior is normal.  Nursing note and vitals reviewed.    UC Treatments / Results  Labs (all labs ordered are listed, but only abnormal results are displayed) Labs Reviewed  COMPLETE METABOLIC PANEL WITH GFR - Abnormal; Notable for the following components:      Result Value   Glucose, Bld 169 (*)    AST 35 (*)    ALT 49 (*)    All other components within normal limits  OVA + PARASITE EXAM  OVA AND PARASITE EXAMINATION  LIPASE  EXTRA URINE SPECIMEN  TIQ-NTM  TIQ-NTM  GASTROINTESTINAL PATHOGEN PANEL PCR  POCT CBC W AUTO DIFF ([facility])    EKG  EKG Interpretation None       Radiology No results found.  Procedures Procedures (including critical care time)  Medications Ordered in UC Medications - No data to display   Initial Impression / Assessment and Plan / UC Course  I have reviewed the triage vital signs and the nursing notes.  Pertinent labs & imaging results that were available during my care of the patient were reviewed by me and considered in my medical decision making (see chart for details).     Due to duration and severity of diarrhea, will cover for infectious diarrhea with Cipro and Flagyl as stool culture pending.   Final Clinical Impressions(s) / UC Diagnoses   Final diagnoses:  Diarrhea of presumed infectious origin  Nausea without vomiting    ED Discharge Orders        Ordered    ciprofloxacin (CIPRO) 500 MG tablet  2 times daily     10/01/17 2005    metroNIDAZOLE (FLAGYL) 500 MG  tablet  3 times daily     10/01/17 2005    ondansetron (ZOFRAN) 4 MG tablet  Every 6 hours     10/01/17 2005       Controlled Substance Prescriptions [place] Controlled Substance Registry consulted? Not Applicable   Tyrell Antonio 10/03/17 [id number]

## 2017-10-02 LAB — TIQ-NTM

## 2017-10-03 ENCOUNTER — Telehealth: Payer: Self-pay | Admitting: *Deleted

## 2017-10-03 NOTE — Telephone Encounter (Signed)
Spoke to pt given BW results. Stool cx still pending.

## 2017-10-08 LAB — COMPLETE METABOLIC PANEL WITH GFR
AG Ratio: 1.5 (calc) (ref 1.0–2.5)
ALT: 49 U/L — ABNORMAL HIGH (ref 6–29)
AST: 35 U/L — ABNORMAL HIGH (ref 10–30)
Albumin: 4.6 g/dL (ref 3.6–5.1)
Alkaline phosphatase (APISO): 73 U/L (ref 33–115)
BUN: 14 mg/dL (ref 7–25)
CO2: 25 mmol/L (ref 20–32)
Calcium: 9.3 mg/dL (ref 8.6–10.2)
Chloride: 99 mmol/L (ref 98–110)
Creat: 0.69 mg/dL (ref 0.50–1.10)
GFR, Est African American: 124 mL/min/{1.73_m2} (ref >=60)
GFR, Est Non African American: 107 mL/min/{1.73_m2} (ref >=60)
Globulin: 3.1 g/dL (calc) (ref 1.9–3.7)
Glucose, Bld: 169 mg/dL — ABNORMAL HIGH (ref 65–99)
Potassium: 3.9 mmol/L (ref 3.5–5.3)
Sodium: 135 mmol/L (ref 135–146)
Total Bilirubin: 0.7 mg/dL (ref 0.2–1.2)
Total Protein: 7.7 g/dL (ref 6.1–8.1)

## 2017-10-08 LAB — GASTROINTESTINAL PATHOGEN PANEL PCR
C. difficile Tox A/B, PCR: NOT DETECTED
Campylobacter, PCR: NOT DETECTED
Cryptosporidium, PCR: NOT DETECTED
E coli (ETEC) LT/ST PCR: NOT DETECTED
E coli (STEC) stx1/stx2, PCR: NOT DETECTED
E coli 0157, PCR: NOT DETECTED
Giardia lamblia, PCR: NOT DETECTED
Norovirus, PCR: NOT DETECTED
Rotavirus A, PCR: NOT DETECTED
Salmonella, PCR: NOT DETECTED
Shigella, PCR: NOT DETECTED

## 2017-10-08 LAB — LIPASE: Lipase: 35 U/L (ref 7–60)

## 2017-10-08 LAB — EXTRA URINE SPECIMEN

## 2017-10-09 ENCOUNTER — Telehealth: Payer: Self-pay

## 2017-10-09 NOTE — Telephone Encounter (Signed)
Pt notified of lab results.  Will follow up as need.

## 2017-10-12 LAB — TIQ-NTM

## 2017-10-12 LAB — OVA AND PARASITE EXAMINATION
CONCENTRATE RESULT: NONE SEEN
TRICHROME RESULT: NONE SEEN

## 2017-10-26 ENCOUNTER — Telehealth: Payer: Self-pay | Admitting: Cardiology

## 2017-10-26 NOTE — Telephone Encounter (Signed)
Upon speaking with the patient she stated that she has been experiencing increased palpitations; her Wellbutrin was recently increased. Once reviewing the literature tachyarrhythmia is a adverse effect of this medication. Per Dr. Geraldo Pitter it was recommended that the patient speak with her PCP regarding this medication.

## 2017-10-26 NOTE — Telephone Encounter (Signed)
Still having palpitations even though she's taking metoprolol

## 2018-02-10 ENCOUNTER — Other Ambulatory Visit: Payer: Self-pay

## 2018-02-10 ENCOUNTER — Emergency Department (INDEPENDENT_AMBULATORY_CARE_PROVIDER_SITE_OTHER)
Admission: EM | Admit: 2018-02-10 | Discharge: 2018-02-10 | Disposition: A | Discharge status: Home / Self Care | Payer: Medicaid Other | Source: Home / Self Care | Attending: Family Medicine | Admitting: Family Medicine

## 2018-02-10 ENCOUNTER — Encounter: Payer: Self-pay | Admitting: Emergency Medicine

## 2018-02-10 DIAGNOSIS — B029 Zoster without complications: Secondary | ICD-10-CM

## 2018-02-10 HISTORY — DX: Major depressive disorder, single episode, unspecified: F32.9

## 2018-02-10 HISTORY — DX: Depression, unspecified: F32.A

## 2018-02-10 MED ORDER — VALACYCLOVIR HCL 1 G PO TABS: 1000.0000 mg | ORAL_TABLET | Freq: Three times a day (TID) | ORAL | 0 refills | Status: DC

## 2018-02-10 MED ORDER — TRIFLURIDINE 1 % OP SOLN: OPHTHALMIC | 0 refills | Status: DC

## 2018-02-10 NOTE — ED Provider Notes (Signed)
[name]    CSN: [id number] Arrival date & time: 02/10/18  1209     History   Chief Complaint Chief Complaint  Patient presents with  . Ear Problem  . Eye Problem    HPI Tiffany Landry is a 42 y.o. female.   Patient complains of onset of vague mild soreness around her right eye six days ago.  Four days ago she noticed several "pimple" like lesions on her right vertex area. Three days ago she developed pain in her right temple that began to radiate to her right occipital area. She also complains of pain in her right ear.  She has had intermittent sweats.  No sore throat or nasal congestion.  She notes mild right eye photophobia but no redness or drainage from her right eye.  No rash on her face or nose. She visited her PCP three days ago who prescribed amoxicillin and antibiotic ear drops.  She feels that her symptoms have become worse. Patient reports that her last Hgb A1c was over 9 about 3 months ago.  The history is provided by the patient.    Past Medical History:  Diagnosis Date  . Depression   . Diabetes mellitus without complication ([place])   . Hyperlipidemia   . Hypertension     Patient Active Problem List   Diagnosis Date Noted  . Palpitations 05/04/2017  . Essential hypertension 05/04/2017  . Acute meniscal tear of right knee 02/06/2017  . Right knee pain 03/24/2016  . Snoring 02/28/2016  . Suspected sleep apnea 02/28/2016  . Microalbuminuria 03/12/2015  . Major depressive disorder, single episode, severe without psychotic features ([place]) 11/04/2014  . Obsessive compulsive disorder 11/04/2014  . Financial difficulty 05/16/2013  . Endometrial cancer ([place]) 03/11/2013  . 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 Obesity, Class III, BMI 40-49.9 (morbid obesity) ([place]) 01/21/2013  . HSV-1 (herpes simplex virus 1) infection 05/21/2012  . Chest pain 11/17/2011  . Abdominal hernia without obstruction or gangrene 09/17/2011  . Anxiety  09/17/2011  . Epigastric pain 09/17/2011  . Other fatigue 09/17/2011    Past Surgical History:  Procedure Laterality Date  . ABDOMINAL HYSTERECTOMY    . CHOLECYSTECTOMY      OB History   None      Home Medications    Prior to Admission medications   Medication Sig Start Date End Date Taking? Authorizing Provider  amoxicillin (AMOXIL) 875 MG tablet Take 850 mg by mouth 2 (two) times daily.   Yes [provider]  FLUoxetine (PROZAC) 40 MG capsule Take 50 mg by mouth daily.   Yes [provider]  aspirin 81 MG tablet Take by mouth.    [provider]  buPROPion (WELLBUTRIN XL) 150 MG 24 hr tablet Take by mouth. 07/10/17 10/08/17  [provider]  CINNAMON PO Take 500 mg by mouth 4 (four) times daily.    [provider]  ciprofloxacin (CIPRO) 500 MG tablet Take 1 tablet (500 mg total) by mouth 2 (two) times daily. One po bid x 7 days 10/01/17   Noe Gens, PA-C  fluvoxaMINE (LUVOX) 25 MG tablet Take 25 mg by mouth 2 (two) times daily. 04/17/17 05/17/17  [provider]  glucose blood (ONE TOUCH ULTRA TEST) test strip 1 Container by Misc.(Non-Drug; Combo Route) route daily. 11/26/12   [provider]  metFORMIN (GLUCOPHAGE) 1000 MG tablet Take 1,000 mg by mouth 2 (two) times daily with a meal.    [provider]  metoprolol tartrate (LOPRESSOR) 25 MG tablet Take 1 tablet (25 mg total) 2 (two) times daily by mouth. 07/19/17 10/17/17  Revankar, Reita Cliche, MD  metroNIDAZOLE (FLAGYL) 500 MG tablet Take 1 tablet (500 mg total) by mouth 3 (three) times daily. 10/01/17   Noe Gens, PA-C  OMEGA-3 FATTY ACIDS PO Take 1,200 mg by mouth 2 (two) times daily.    [provider]  omeprazole (PRILOSEC) 20 MG capsule Take by mouth. 07/13/17   [provider]  ondansetron (ZOFRAN) 4 MG tablet Take 1 tablet (4 mg total) by mouth every 6 (six) hours. 10/01/17   Noe Gens, PA-C  Red Yeast Rice 600 MG CAPS Take 1,200  mg by mouth daily.    [provider]  trifluridine (VIROPTIC) 1 % ophthalmic solution Place one gtt in right eye Q4hr while awake 02/10/18   Kandra Nicolas, MD  valACYclovir (VALTREX) 1000 MG tablet Take 1 tablet (1,000 mg total) by mouth 3 (three) times daily. 02/10/18   Kandra Nicolas, MD  VITAMIN E PO Take 180 mg by mouth daily.    [provider]    Family History Family History  Problem Relation Age of Onset  . Heart disease Father   . Arrhythmia Father   . Heart disease Paternal Grandfather     Social History Social History   Tobacco Use  . Smoking status: Never Smoker  . Smokeless tobacco: Never Used  Substance Use Topics  . Alcohol use: No  . Drug use: No     Allergies   Patient has no known allergies.   Review of Systems Review of Systems No sore throat No cough No pleuritic pain No wheezing No nasal congestion No post-nasal drainage ? sinus pain/pressure right face No itchy/red eyes, but mild soreness right upper eyelid. + right earache No hemoptysis No SOB No fever/chills; + sweats No nausea No vomiting No abdominal pain No diarrhea No urinary symptoms + rash scalp + fatigue No myalgias + headache    Physical Exam Triage Vital Signs ED Triage Vitals  Enc Vitals Group     BP 02/10/18 1229 127/85     Pulse Rate 02/10/18 1229 76     Resp 02/10/18 1229 18     Temp 02/10/18 1229 98.3 F (36.8 C)     Temp Source 02/10/18 1229 Oral     SpO2 02/10/18 1229 100 %     Weight 02/10/18 1230 296 lb (134.3 kg)     Height 02/10/18 1230 5\' 7"  (1.702 m)     Head Circumference --      Peak Flow --      Pain Score 02/10/18 1229 8     Pain Loc --      Pain Edu? --      Excl. in [place]? --    No data found.  Updated Vital Signs BP 127/85 (BP Location: Right Arm)   Pulse 76   Temp 98.3 F (36.8 C) (Oral)   Resp 18   Ht 5\' 7"  (1.702 m)   Wt 296 lb (134.3 kg)   SpO2 100%   BMI 46.36 kg/m   Visual Acuity Right Eye Distance:     Left Eye Distance:   Bilateral Distance:    Right Eye Near:   Left Eye Near:    Bilateral Near:     Physical Exam  Constitutional: She appears well-developed and well-nourished. No distress.  HENT:  Head:    Right Ear: External  ear normal.  Left Ear: External ear normal.  Nose: Nose normal.  Mouth/Throat: Oropharynx is clear and moist.  Patient has pain in area right forehead, and temporal/parietal area as noted on diagram, although there is no tenderness to palpation there.  She does have mild tenderness over the right upper eyelid.  There are two herpetic-appearing erythematous vesicular lesions on scalp as noted on diagram.   Eyes: Pupils are equal, round, and reactive to light. Conjunctivae and EOM are normal. Right eye exhibits no chemosis, no discharge, no exudate and no hordeolum.  Fundi benign.  Neck: Neck supple.  Cardiovascular: Normal rate.  Pulmonary/Chest: Effort normal.  Lymphadenopathy:    She has no cervical adenopathy.  Neurological: She is alert.  Skin: Skin is warm and dry.  Nursing note and vitals reviewed.    UC Treatments / Results  Labs (all labs ordered are listed, but only abnormal results are displayed) Labs Reviewed - No data to display  EKG None  Radiology No results found.  Procedures Procedures (including critical care time)  Medications Ordered in UC Medications - No data to display  Initial Impression / Assessment and Plan / UC Course  I have reviewed the triage vital signs and the nursing notes.  Pertinent labs & imaging results that were available during my care of the patient were reviewed by me and considered in my medical decision making (see chart for details).    Begin Valtrex. Although patient does not appear to have herpes zoster keratoconjunctivitis on exaqm, she does have light sensitivity.  Therefore, will also begin Viroptic one gtt in right eye Q4hr while awake. Recommend that she visit her ophthalmologist as soon  as possible for eye follow-up. Recommend follow-up if not improving about 8 days. Will refrain from prescribing prednisone because of her poorly controlled diabetes.   Final Clinical Impressions(s) / UC Diagnoses   Final diagnoses:  Herpes zoster without complication     Discharge Instructions     If symptoms become significantly worse during the night or over the weekend, proceed to the local emergency room.     ED Prescriptions    Medication Sig Dispense Auth. Provider   valACYclovir (VALTREX) 1000 MG tablet Take 1 tablet (1,000 mg total) by mouth 3 (three) times daily. 21 tablet Kandra Nicolas, MD   trifluridine (VIROPTIC) 1 % ophthalmic solution Place one gtt in right eye Q4hr while awake 7.5 mL Kandra Nicolas, MD         Kandra Nicolas, MD 02/10/18 1350

## 2018-02-10 NOTE — ED Provider Notes (Signed)
[name]    CSN: [id number] Arrival date & time: 02/10/18  1209     History   Chief Complaint Chief Complaint  Patient presents with  . Ear Problem  . Eye Problem    HPI Cris Gibby is a 42 y.o. female.   HPI  Past Medical History:  Diagnosis Date  . Diabetes mellitus without complication ([place])   . Hyperlipidemia   . Hypertension     Patient Active Problem List   Diagnosis Date Noted  . Palpitations 05/04/2017  . Essential hypertension 05/04/2017  . Acute meniscal tear of right knee 02/06/2017  . Right knee pain 03/24/2016  . Snoring 02/28/2016  . Suspected sleep apnea 02/28/2016  . Microalbuminuria 03/12/2015  . Major depressive disorder, single episode, severe without psychotic features ([place]) 11/04/2014  . Obsessive compulsive disorder 11/04/2014  . Financial difficulty 05/16/2013  . Endometrial cancer ([place]) 03/11/2013  . DM type 2 with diabetic dyslipidemia ([place]) 01/21/2013  . Obesity, Class III, BMI 40-49.9 (morbid obesity) ([place]) 01/21/2013  . HSV-1 (herpes simplex virus 1) infection 05/21/2012  . Chest pain 11/17/2011  . Abdominal hernia without obstruction or gangrene 09/17/2011  . Anxiety 09/17/2011  . Epigastric pain 09/17/2011  . Other fatigue 09/17/2011    Past Surgical History:  Procedure Laterality Date  . ABDOMINAL HYSTERECTOMY    . CHOLECYSTECTOMY      OB History   None      Home Medications    Prior to Admission medications   Medication Sig Start Date End Date Taking? Authorizing Provider  aspirin 81 MG tablet Take by mouth.    [provider]  buPROPion (WELLBUTRIN XL) 150 MG 24 hr tablet Take by mouth. 07/10/17 10/08/17  [provider]  CINNAMON PO Take 500 mg by mouth 4 (four) times daily.    [provider]  ciprofloxacin (CIPRO) 500 MG tablet Take 1 tablet (500 mg total) by mouth 2 (two) times daily. One po bid x 7 days 10/01/17   Noe Gens, PA-C  fluvoxaMINE (LUVOX) 25 MG tablet  Take 25 mg by mouth 2 (two) times daily. 04/17/17 05/17/17  [provider]  glucose blood (ONE TOUCH ULTRA TEST) test strip 1 Container by Misc.(Non-Drug; Combo Route) route daily. 11/26/12   [provider]  metFORMIN (GLUCOPHAGE) 1000 MG tablet Take 1,000 mg by mouth 2 (two) times daily with a meal.    [provider]  metoprolol tartrate (LOPRESSOR) 25 MG tablet Take 1 tablet (25 mg total) 2 (two) times daily by mouth. 07/19/17 10/17/17  Revankar, Reita Cliche, MD  metroNIDAZOLE (FLAGYL) 500 MG tablet Take 1 tablet (500 mg total) by mouth 3 (three) times daily. 10/01/17   Noe Gens, PA-C  OMEGA-3 FATTY ACIDS PO Take 1,200 mg by mouth 2 (two) times daily.    [provider]  omeprazole (PRILOSEC) 20 MG capsule Take by mouth. 07/13/17   [provider]  ondansetron (ZOFRAN) 4 MG tablet Take 1 tablet (4 mg total) by mouth every 6 (six) hours. 10/01/17   Noe Gens, PA-C  Red Yeast Rice 600 MG CAPS Take 1,200 mg by mouth daily.    [provider]  VITAMIN E PO Take 180 mg by mouth daily.    [provider]    Family History Family History  Problem Relation Age of Onset  . Heart disease Father   . Arrhythmia Father   . Heart disease Paternal Grandfather     Social History Social  History   Tobacco Use  . Smoking status: Never Smoker  . Smokeless tobacco: Never Used  Substance Use Topics  . Alcohol use: No  . Drug use: No     Allergies   Patient has no known allergies.   Review of Systems Review of Systems   Physical Exam Triage Vital Signs  No data found.  Updated Vital Signs BP 127/85 (BP Location: Right Arm)   Pulse 76   Temp 98.3 F (36.8 C) (Oral)   Resp 18   Ht 5\' 7"  (1.702 m)   Wt 296 lb (134.3 kg)   SpO2 100%   BMI 46.36 kg/m   Visual Acuity Right Eye Distance:   Left Eye Distance:   Bilateral Distance:    Right Eye Near:   Left Eye Near:    Bilateral Near:     Physical Exam   UC  Treatments / Results  Labs (all labs ordered are listed, but only abnormal results are displayed) Labs Reviewed - No data to display  EKG None  Radiology No results found.  Procedures Procedures (including critical care time)  Medications Ordered in UC Medications - No data to display  Initial Impression / Assessment and Plan / UC Course  I have reviewed the triage vital signs and the nursing notes.  Pertinent labs & imaging results that were available during my care of the patient were reviewed by me and considered in my medical decision making (see chart for details).     Final Clinical Impressions(s) / UC Diagnoses   Final diagnoses:  None   Discharge Instructions   None    ED Prescriptions    None     Controlled Substance Prescriptions [place] Controlled Substance Registry consulted? Not Applicable   Scot Jun, [place] 02/11/18 [phone]

## 2018-02-10 NOTE — ED Triage Notes (Signed)
Patient has had intermittent right eye and ear and side of head stabbing pains for past 3 days; was seen by PCP and placed on antibiotic and drops which have not stopped the problem; also reports 2 sores on top of her head.

## 2018-02-10 NOTE — Discharge Instructions (Signed)
If symptoms become significantly worse during the night or over the weekend, proceed to the local emergency room.  

## 2018-02-11 ENCOUNTER — Telehealth: Payer: Self-pay

## 2018-02-11 NOTE — Telephone Encounter (Signed)
Pt called requesting pain medication.  Spoke with provider, and she recommended following up in ER due to the nature of illness.  Notified patient.

## 2018-09-06 IMAGING — NM NM MISC PROCEDURE
6 series · 36 of 36 positions shown · non-contrast
Comparison: none

[Series 1: wbr_r-proj_st rest_(id)_sa · 6.5mm · 6.51mm/px · 6 of 64 frames shown (1 of 2)]
[frame 6/64]
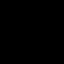
[frame 16/64]
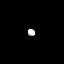
[frame 27/64]
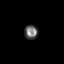
[frame 38/64]
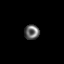
[frame 48/64]
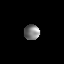
[frame 59/64]
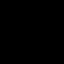

[Series 1: wbr_s-proj_st stress_(id)_sa · 6.5mm · 6.51mm/px · 6 of 512 frames shown (1 of 4)]
[frame 43/512]
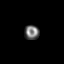
[frame 128/512]
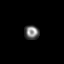
[frame 214/512]
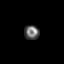
[frame 299/512]
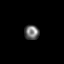
[frame 384/512]
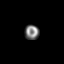
[frame 470/512]
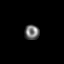

[Series 1: wbr_s-proj_st stress_(id)_sa · 6.5mm · 6.51mm/px · 6 of 64 frames shown (2 of 4)]
[frame 6/64]
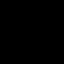
[frame 16/64]
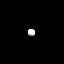
[frame 27/64]
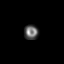
[frame 38/64]
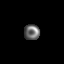
[frame 48/64]
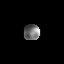
[frame 59/64]
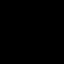

[Series 1: wbr_s-proj_st stress_(id)_sa · 6.5mm · 6.51mm/px · 6 of 512 frames shown (3 of 4)]
[frame 43/512]
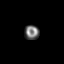
[frame 128/512]
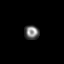
[frame 214/512]
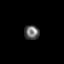
[frame 299/512]
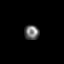
[frame 384/512]
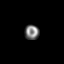
[frame 470/512]
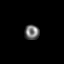

[Series 1: wbr_s-proj_st stress_(id)_sa · 6.5mm · 6.51mm/px · 6 of 64 frames shown (4 of 4)]
[frame 6/64]
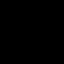
[frame 16/64]
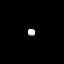
[frame 27/64]
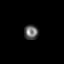
[frame 38/64]
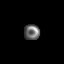
[frame 48/64]
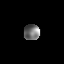
[frame 59/64]
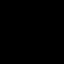

[Series 1: wbr_r-proj_st rest_(id)_sa · 6.5mm · 6.51mm/px · 6 of 64 frames shown (2 of 2)]
[frame 6/64]
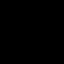
[frame 16/64]
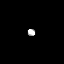
[frame 27/64]
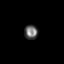
[frame 38/64]
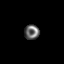
[frame 48/64]
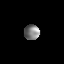
[frame 59/64]
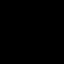

[36 of 36 positions shown; findings below may reference images not displayed]

Canned report from images found in remote index.

Refer to host system for actual result text.

## 2019-08-14 ENCOUNTER — Emergency Department (INDEPENDENT_AMBULATORY_CARE_PROVIDER_SITE_OTHER): Payer: Medicaid Other

## 2019-08-14 ENCOUNTER — Emergency Department
Admission: EM | Admit: 2019-08-14 | Discharge: 2019-08-14 | Disposition: A | Discharge status: Home / Self Care | Payer: Medicaid Other | Source: Home / Self Care | Attending: Family Medicine | Admitting: Family Medicine

## 2019-08-14 ENCOUNTER — Other Ambulatory Visit: Payer: Self-pay

## 2019-08-14 ENCOUNTER — Encounter: Payer: Self-pay | Admitting: Emergency Medicine

## 2019-08-14 DIAGNOSIS — M940 Chondrocostal junction syndrome [Tietze]: Secondary | ICD-10-CM | POA: Diagnosis not present

## 2019-08-14 DIAGNOSIS — R079 Chest pain, unspecified: Secondary | ICD-10-CM

## 2019-08-14 MED ORDER — PREDNISONE 20 MG PO TABS: ORAL_TABLET | ORAL | 0 refills | Status: AC

## 2019-08-14 NOTE — ED Triage Notes (Signed)
RT shoulder pain x 3 days, radiates down into RT chest

## 2019-08-14 NOTE — Discharge Instructions (Addendum)
Apply ice pack for 20 to 30 minutes, 3 to 4 times daily  Continue until pain decreases.  May take Tylenol at bedtime if needed for pain.
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
## 2020-09-13 ENCOUNTER — Emergency Department: Admit: 2020-09-13 | Discharge status: Absent without leave | Payer: Self-pay

## 2020-09-13 ENCOUNTER — Emergency Department (INDEPENDENT_AMBULATORY_CARE_PROVIDER_SITE_OTHER)
Admission: EM | Admit: 2020-09-13 | Discharge: 2020-09-13 | Disposition: A | Discharge status: Home / Self Care | Payer: Medicaid Other | Source: Home / Self Care

## 2020-09-13 DIAGNOSIS — R0982 Postnasal drip: Secondary | ICD-10-CM

## 2020-09-13 DIAGNOSIS — R059 Cough, unspecified: Secondary | ICD-10-CM

## 2020-09-13 DIAGNOSIS — J029 Acute pharyngitis, unspecified: Secondary | ICD-10-CM

## 2020-09-13 MED ORDER — BENZONATATE 100 MG PO CAPS: 100.0000 mg | ORAL_CAPSULE | Freq: Three times a day (TID) | ORAL | 0 refills | Status: AC

## 2020-09-13 MED ORDER — IPRATROPIUM BROMIDE 0.06 % NA SOLN: 2.0000 | Freq: Four times a day (QID) | NASAL | 1 refills | Status: AC

## 2020-09-13 NOTE — Discharge Instructions (Signed)
  You may take tylenol every 4-6 hours as needed for pain.  Call to schedule a follow up with primary care if not improving in 1 week, or new symptoms develop- chest pain, trouble breathing, fever or vomiting.

## 2020-09-13 NOTE — ED Triage Notes (Signed)
Patient presents to Urgent Care with complaints of cough since about 3-4 days ago. Patient reports it started when she was on the floor trying to fix her leaky water heater. Pt states she has drainage going down the back of her throat but does not have a runny nose. Was tested for covid yesterday and it was negative.

## 2020-09-13 NOTE — ED Provider Notes (Signed)
[name]    CSN: [id number] Arrival date & time: 09/13/20  0934      History   Chief Complaint Chief Complaint  Patient presents with  . Cough    HPI Tiffany Landry is a 45 y.o. female.   HPI Tiffany Landry is a 45 y.o. female presenting to UC with c/o 3-4 days of cough that started after she was lying on the floor for about 15-41minutes trying to fix her leaking water heating. Pt thinks she breathed in some of mold, triggering a cough, postnasal drainage and scratchy throat. She had a rapid COVID test yesterday, which was negative. Denies fever, chills, n/v/d. No body aches, chest pain or SOB. No known sick contacts. No medication taken PTA.    Past Medical History:  Diagnosis Date  . Depression   . Diabetes mellitus without complication ([place])   . Hyperlipidemia   . Hypertension     Patient Active Problem List   Diagnosis Date Noted  . Palpitations 05/04/2017  . Essential hypertension 05/04/2017  . Acute meniscal tear of right knee 02/06/2017  . Right knee pain 03/24/2016  . Snoring 02/28/2016  . Suspected sleep apnea 02/28/2016  . Microalbuminuria 03/12/2015  . Major depressive disorder, single episode, severe without psychotic features ([place]) 11/04/2014  . Obsessive compulsive disorder 11/04/2014  . Financial difficulty 05/16/2013  . Endometrial cancer ([place]) 03/11/2013  . DM type 2 with diabetic dyslipidemia ([place]) 01/21/2013  . Obesity, Class III, BMI 40-49.9 (morbid obesity) ([place]) 01/21/2013  . HSV-1 (herpes simplex virus 1) infection 05/21/2012  . Chest pain 11/17/2011  . Abdominal hernia without obstruction or gangrene 09/17/2011  . Anxiety 09/17/2011  . Epigastric pain 09/17/2011  . Other fatigue 09/17/2011    Past Surgical History:  Procedure Laterality Date  . ABDOMINAL HYSTERECTOMY    . CHOLECYSTECTOMY      OB History   No obstetric history on file.      Home Medications    Prior to Admission medications   Medication Sig Start  Date End Date Taking? Authorizing Provider  atorvastatin (LIPITOR) 20 MG tablet Take 20 mg by mouth daily.   Yes [provider]  benzonatate (TESSALON) 100 MG capsule Take 1 capsule (100 mg total) by mouth every 8 (eight) hours. 09/13/20  Yes Myya Meenach O, PA-C  Dulaglutide (TRULICITY) 1.5 LO/7.5IE SOPN Inject into the skin.   Yes [provider]  ipratropium (ATROVENT) 0.06 % nasal spray Place 2 sprays into both nostrils 4 (four) times daily. 09/13/20  Yes Noe Gens, PA-C  methylphenidate 27 MG PO CR tablet Take 27 mg by mouth every morning.   Yes [provider]  metoprolol succinate (TOPROL-XL) 25 MG 24 hr tablet Take 25 mg by mouth daily.   Yes [provider]  zolpidem (AMBIEN) 10 MG tablet Take 10 mg by mouth at bedtime as needed for sleep.   Yes [provider]  aspirin 81 MG tablet Take by mouth.    [provider]  buPROPion (WELLBUTRIN XL) 150 MG 24 hr tablet Take by mouth. 07/10/17 10/08/17  [provider]  FLUoxetine (PROZAC) 40 MG capsule Take 50 mg by mouth daily.    [provider]  fluvoxaMINE (LUVOX) 25 MG tablet Take 25 mg by mouth 2 (two) times daily. 04/17/17 05/17/17  [provider]  glucose blood (ONE TOUCH ULTRA TEST) test strip 1 Container by Misc.(Non-Drug; Combo Route) route daily. 11/26/12   [provider]  lisinopril (ZESTRIL) 10 MG  tablet Take 10 mg by mouth daily.    [provider]  metFORMIN (GLUCOPHAGE) 1000 MG tablet Take 1,000 mg by mouth 2 (two) times daily with a meal.    [provider]  OMEGA-3 FATTY ACIDS PO Take 1,200 mg by mouth 2 (two) times daily.    [provider]  predniSONE (DELTASONE) 20 MG tablet Take one tab by mouth twice daily for 4 days, then one daily for 3 days. Take with food. 08/14/19   Kandra Nicolas, MD    Family History Family History  Problem Relation Age of Onset  . Heart disease Father   . Arrhythmia Father    . Heart disease Paternal Grandfather   . Healthy Mother     Social History Social History   Tobacco Use  . Smoking status: Never Smoker  . Smokeless tobacco: Never Used  Vaping Use  . Vaping Use: Never used  Substance Use Topics  . Alcohol use: Not Currently  . Drug use: No     Allergies   Patient has no known allergies.   Review of Systems Review of Systems  Constitutional: Negative for chills and fever.  HENT: Positive for congestion, postnasal drip and sore throat (scratchy). Negative for ear pain, trouble swallowing and voice change.   Respiratory: Positive for cough. Negative for shortness of breath.   Cardiovascular: Negative for chest pain and palpitations.  Gastrointestinal: Negative for abdominal pain, diarrhea, nausea and vomiting.  Musculoskeletal: Negative for arthralgias, back pain and myalgias.  Skin: Negative for rash.  All other systems reviewed and are negative.    Physical Exam Triage Vital Signs ED Triage Vitals  Enc Vitals Group     BP 09/13/20 1009 (!) 129/96     Pulse Rate 09/13/20 1009 79     Resp 09/13/20 1009 16     Temp 09/13/20 1009 98 F (36.7 C)     Temp Source 09/13/20 1009 Oral     SpO2 09/13/20 1009 100 %     Weight --      Height --      Head Circumference --      Peak Flow --      Pain Score 09/13/20 1004 0     Pain Loc --      Pain Edu? --      Excl. in [place]? --    No data found.  Updated Vital Signs BP (!) 129/96 (BP Location: Right Arm) Comment: no bp meds yet today  Pulse 79   Temp 98 F (36.7 C) (Oral)   Resp 16   SpO2 100%   Visual Acuity Right Eye Distance:   Left Eye Distance:   Bilateral Distance:    Right Eye Near:   Left Eye Near:    Bilateral Near:     Physical Exam Vitals and nursing note reviewed.  Constitutional:      General: She is not in acute distress.    Appearance: Normal appearance. She is well-developed and well-nourished. She is not ill-appearing, toxic-appearing or diaphoretic.   HENT:     Head: Normocephalic and atraumatic.     Right Ear: Tympanic membrane and ear canal normal.     Left Ear: Tympanic membrane and ear canal normal.     Nose: Nose normal.     Right Sinus: No maxillary sinus tenderness or frontal sinus tenderness.     Left Sinus: No maxillary sinus tenderness or frontal sinus tenderness.     Mouth/Throat:  Lips: Pink.     Mouth: Mucous membranes are moist.     Pharynx: Oropharynx is clear. Uvula midline. No pharyngeal swelling, oropharyngeal exudate, posterior oropharyngeal erythema or uvula swelling.  Eyes:     Extraocular Movements: EOM normal.  Cardiovascular:     Rate and Rhythm: Normal rate and regular rhythm.  Pulmonary:     Effort: Pulmonary effort is normal. No respiratory distress.     Breath sounds: Normal breath sounds. No stridor. No wheezing, rhonchi or rales.  Musculoskeletal:        General: Normal range of motion.     Cervical back: Normal range of motion and neck supple.  Lymphadenopathy:     Cervical: No cervical adenopathy.  Skin:    General: Skin is warm and dry.  Neurological:     Mental Status: She is alert and oriented to person, place, and time.  Psychiatric:        Mood and Affect: Mood and affect normal.        Behavior: Behavior normal.      UC Treatments / Results  Labs (all labs ordered are listed, but only abnormal results are displayed) Labs Reviewed - No data to display  EKG   Radiology No results found.  Procedures Procedures (including critical care time)  Medications Ordered in UC Medications - No data to display  Initial Impression / Assessment and Plan / UC Course  I have reviewed the triage vital signs and the nursing notes.  Pertinent labs & imaging results that were available during my care of the patient were reviewed by me and considered in my medical decision making (see chart for details).    Pt had negative COVID test yesterday No body aches fever or chills. Symptoms  started after lying next to a leaking water heater Encouraged symptomatic tx No evidence of bacterial infection at this time Encouraged f/u with PCP later this week if symptoms not improving or new symptoms develop.  Final Clinical Impressions(s) / UC Diagnoses   Final diagnoses:  Cough  Sore throat  Post-nasal drainage     Discharge Instructions      You may take tylenol every 4-6 hours as needed for pain.  Call to schedule a follow up with primary care if not improving in 1 week, or new symptoms develop- chest pain, trouble breathing, fever or vomiting.     ED Prescriptions    Medication Sig Dispense Auth. Provider   benzonatate (TESSALON) 100 MG capsule Take 1 capsule (100 mg total) by mouth every 8 (eight) hours. 21 capsule Gerarda Fraction, Bless Belshe O, PA-C   ipratropium (ATROVENT) 0.06 % nasal spray Place 2 sprays into both nostrils 4 (four) times daily. 15 mL Noe Gens, PA-C     PDMP not reviewed this encounter.   Noe Gens, PA-C 09/13/20 1058
